# Patient Record
Sex: Female | Born: 1975 | Race: Black or African American | Hispanic: No | Smoking: Current every day smoker
Health system: Southern US, Community
[De-identification: ages and names within clinical notes are randomized; demographics above are authoritative.]

---

## 2005-07-23 ENCOUNTER — Inpatient Hospital Stay (HOSPITAL_COMMUNITY): Admission: AD | Admit: 2005-07-23 | Discharge: 2005-07-23 | Payer: Self-pay | Admitting: Gynecology

## 2007-06-21 ENCOUNTER — Emergency Department: Payer: Self-pay | Admitting: Emergency Medicine

## 2007-08-02 ENCOUNTER — Inpatient Hospital Stay: Payer: Self-pay | Admitting: Unknown Physician Specialty

## 2007-08-02 ENCOUNTER — Other Ambulatory Visit: Payer: Self-pay

## 2008-10-22 ENCOUNTER — Emergency Department: Payer: Self-pay | Admitting: Internal Medicine

## 2008-10-22 ENCOUNTER — Emergency Department: Payer: Self-pay | Admitting: Emergency Medicine

## 2008-11-01 ENCOUNTER — Emergency Department: Payer: Self-pay | Admitting: Emergency Medicine

## 2009-01-17 ENCOUNTER — Emergency Department: Payer: Self-pay | Admitting: Emergency Medicine

## 2009-12-01 ENCOUNTER — Emergency Department: Payer: Self-pay | Admitting: Emergency Medicine

## 2010-01-10 ENCOUNTER — Ambulatory Visit: Payer: Self-pay

## 2012-07-18 IMAGING — CR DG TIBIA/FIBULA 2V*L*
1 series · 3 of 3 positions shown · non-contrast
Comparison: none

REASON FOR EXAM: left leg gross deformity
COMMENTS:

PROCEDURE:     DXR - DXR TIBIA AND FIBULA LT (LOWER L  - December 02, 2009 [DATE]
RESULT:     No fracture or dislocation of the tibia or fibula is seen.
Reportedly the patient had a dislocation of the patella that was reduced
during the course of the exam.

[Series 1: view not recorded · 0.17mm/px · 3 of 3 slices shown]
[im 1/3]
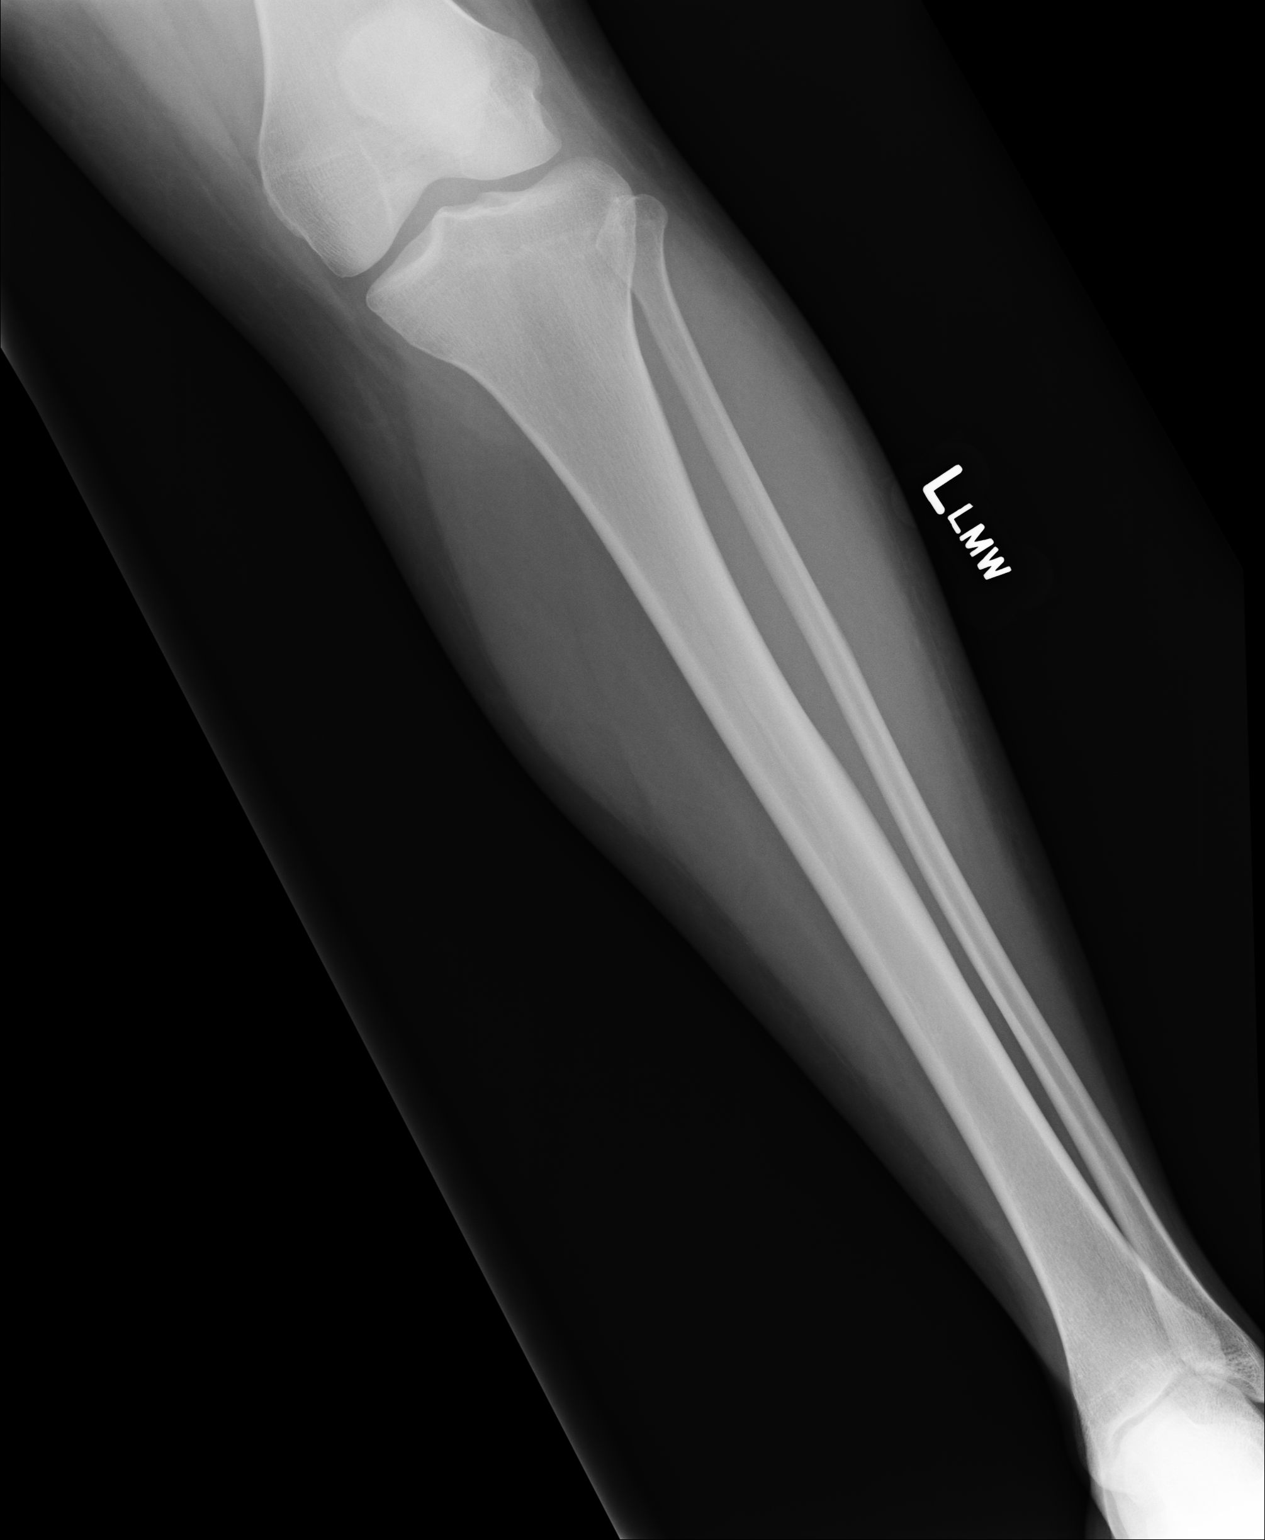
[im 2/3]
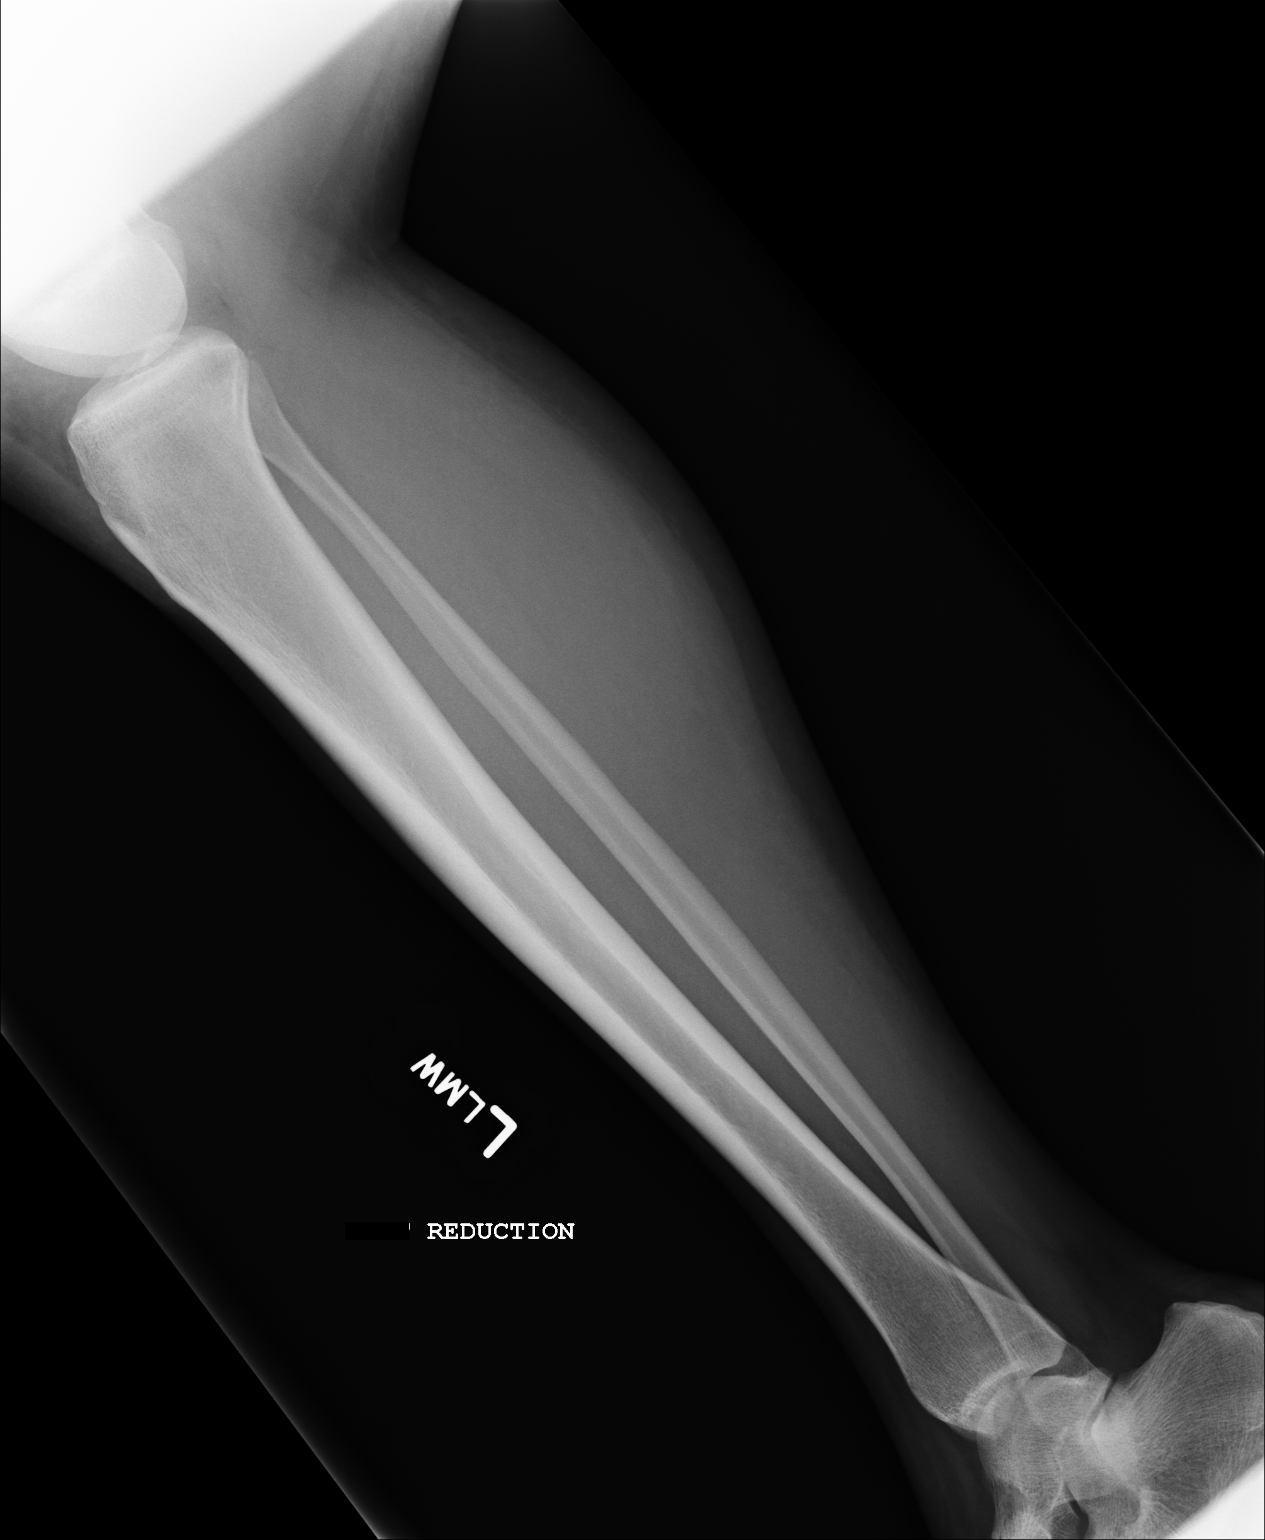
[im 3/3]
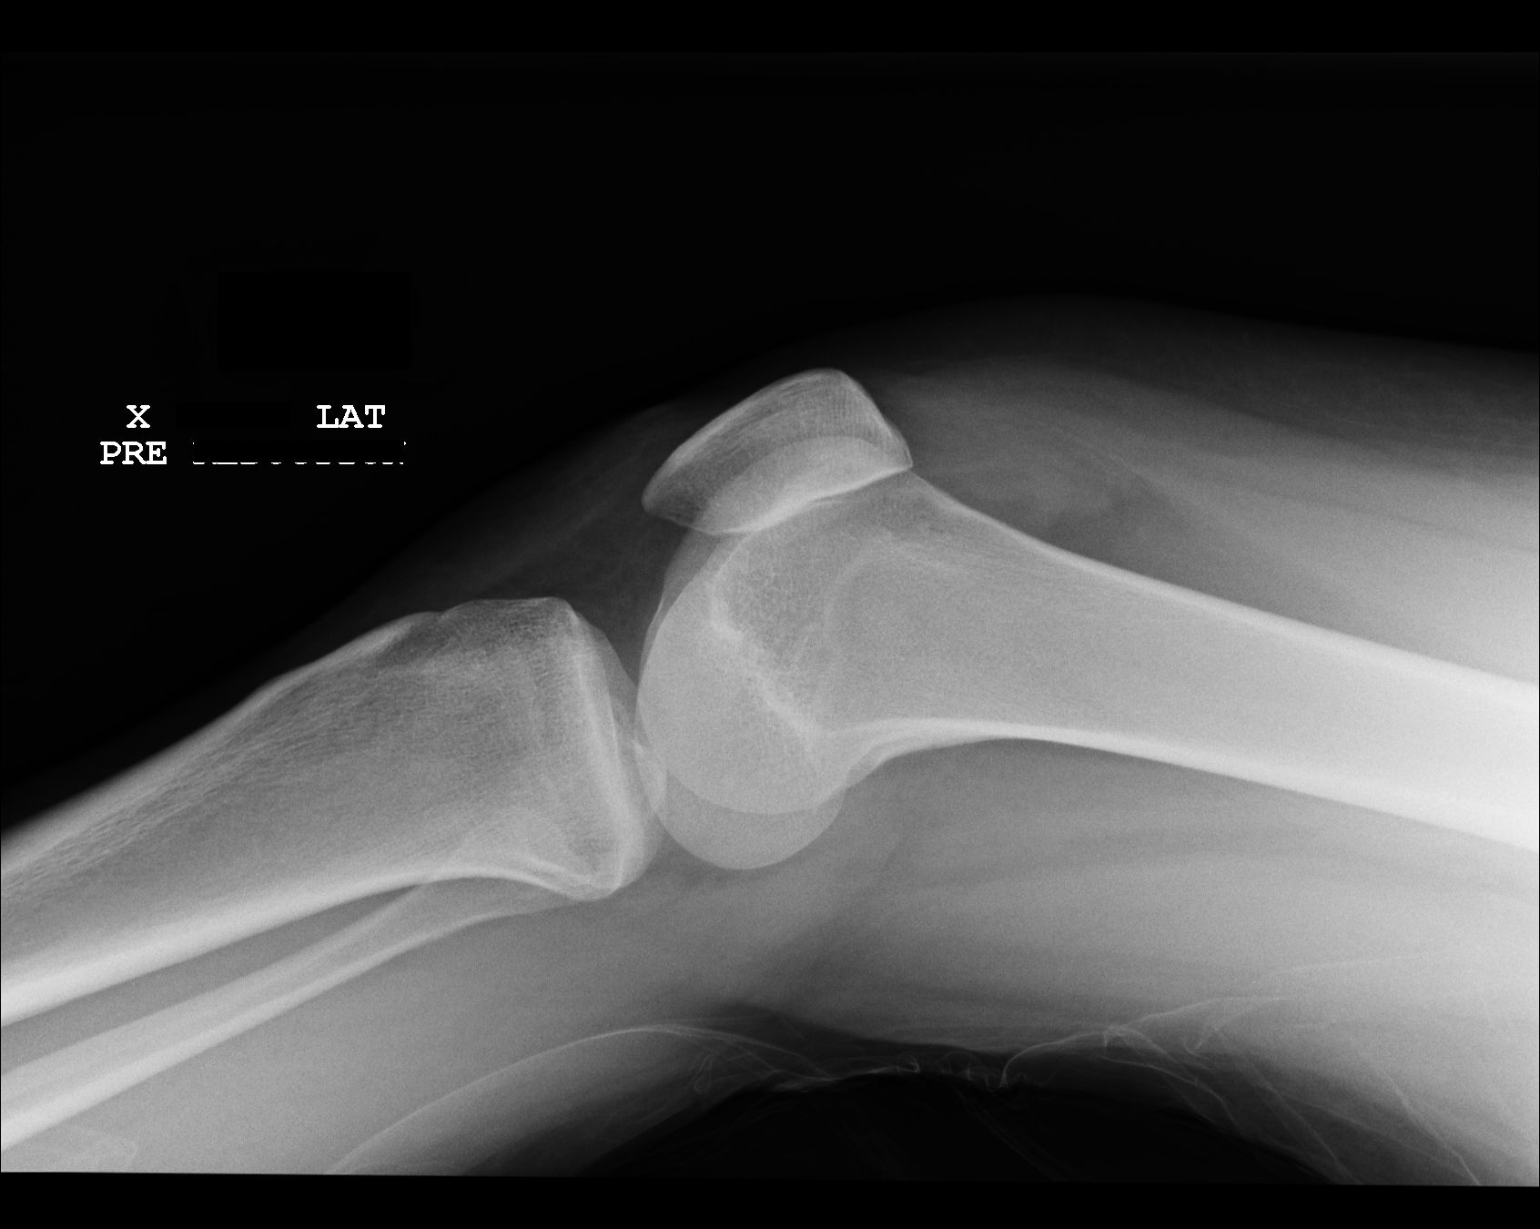

[3 of 3 positions shown; findings below may reference images not displayed]

IMPRESSION: 1.  No acute bony abnormalities of the left lower leg are identified.
2.  The patient reportedly had a dislocation of the patella that was reduced
during the course of this exam.

## 2018-03-22 ENCOUNTER — Other Ambulatory Visit: Payer: Self-pay

## 2018-03-22 ENCOUNTER — Emergency Department
Admission: EM | Admit: 2018-03-22 | Discharge: 2018-03-22 | Disposition: A | Discharge status: Home / Self Care | Payer: Self-pay | Attending: Emergency Medicine | Admitting: Emergency Medicine

## 2018-03-22 ENCOUNTER — Encounter: Payer: Self-pay | Admitting: Emergency Medicine

## 2018-03-22 ENCOUNTER — Emergency Department: Payer: Self-pay

## 2018-03-22 DIAGNOSIS — F17210 Nicotine dependence, cigarettes, uncomplicated: Secondary | ICD-10-CM | POA: Insufficient documentation

## 2018-03-22 DIAGNOSIS — N939 Abnormal uterine and vaginal bleeding, unspecified: Secondary | ICD-10-CM | POA: Insufficient documentation

## 2018-03-22 LAB — POCT PREGNANCY, URINE: Preg Test, Ur: NEGATIVE

## 2018-03-22 NOTE — ED Triage Notes (Signed)
Pt to ED via POV c/o vaginal bleeding and cramping. Pt states that she thought she may be having a miscarriage. Pt denies having positive pregnancy test and states that her LMP was around the first week in January. Pt states that the bleeding started last night and the cramping started this morning. Pt is in NAD at this time.

## 2018-03-22 NOTE — ED Notes (Signed)
Pt not in room upon discharge.  

## 2018-03-22 NOTE — ED Provider Notes (Signed)
[facility] Emergency Department Provider Note   ____________________________________________    I have reviewed the triage vital signs and the nursing notes.   HISTORY  Chief Complaint Vaginal Bleeding     HPI Christy Hayes is a 43 y.o. female who presents with complaints of vaginal bleeding.  Patient reports last night she developed mild pelvic cramping and vaginal bleeding she reports her last menstrual cycle was 3 weeks ago, typically has regular cycles.  She does not believe that she is pregnant.  Denies nausea or vomiting.  No dizziness.  Bleeding and cramping has essentially resolved upon arrival to the emergency department.  History reviewed. No pertinent past medical history.  There are no active problems to display for this patient.   History reviewed. No pertinent surgical history.  Prior to Admission medications   Not on File     Allergies Patient has no known allergies.  No family history on file.  Social History Social History   Tobacco Use  . Smoking status: Current Every Day Smoker  . Smokeless tobacco: Never Used  Substance Use Topics  . Alcohol use: Yes  . Drug use: Yes    Types: Marijuana    Review of Systems  Constitutional: No fever/chills  Cardiovascular: Denies chest pain. Respiratory: Denies shortness of breath. Gastrointestinal: No abdominal pain.   Genitourinary: As above Musculoskeletal: Negative for back pain.  Neurological: Negative for headaches   ____________________________________________   PHYSICAL EXAM:  VITAL SIGNS: ED Triage Vitals [03/22/18 0719]  Enc Vitals Group     BP (!) 126/91     Pulse Rate 89     Resp 16     Temp 98.1 F (36.7 C)     Temp Source Oral     SpO2 99 %     Weight 84.8 kg (187 lb)     Height 1.626 m (5\' 4" )     Head Circumference      Peak Flow      Pain Score 9     Pain Loc      Pain Edu?      Excl. in GC?     Constitutional: Alert and  oriented. No acute distress.  Eyes: Conjunctivae are normal.  Pallor   Cardiovascular: Normal rate, regular rhythm. Grossly normal heart sounds.  Good peripheral circulation. Respiratory: Normal respiratory effort.  No retractions. Lungs CTAB. Gastrointestinal: Soft and nontender. No distention.  No CVA tenderness. Genitourinary: deferred per patient request Musculoskeletal:   Warm and well perfused Neurologic:  Normal speech and language. No gross focal neurologic deficits are appreciated.  Skin:  Skin is warm, dry and intact. No rash noted. Psychiatric: Mood and affect are normal. Speech and behavior are normal.  ____________________________________________   LABS (all labs ordered are listed, but only abnormal results are displayed)  Labs Reviewed  POC URINE PREG, ED  POCT PREGNANCY, URINE   ____________________________________________  EKG  None ____________________________________________  RADIOLOGY  Ultrasound overall reassuring ____________________________________________   PROCEDURES  Procedure(s) performed: No  Procedures   Critical Care performed: No ____________________________________________   INITIAL IMPRESSION / ASSESSMENT AND PLAN / ED COURSE  Pertinent labs & imaging results that were available during my care of the patient were reviewed by me and considered in my medical decision making (see chart for details).  Patient well-appearing in no acute distress.  Presents today with reports of pelvic cramping primarily yesterday with vaginal bleeding now mostly resolved.  Will obtain ultrasound to evaluate for possible  fibroids however overall she is well-appearing with reassuring exam, anticipate GYN follow-up  Ultrasound results are reassuring, I went to deliver results the patient but she has apparently eloped,     ____________________________________________   FINAL CLINICAL IMPRESSION(S) / ED DIAGNOSES  Final diagnoses:  Vaginal bleeding          Note:  This document was prepared using Dragon voice recognition software and may include unintentional dictation errors.   Jene Every, MD 03/22/18 1053

## 2018-03-22 NOTE — ED Notes (Signed)
Pt home phone called at this time with no answer.

## 2018-03-22 NOTE — ED Notes (Signed)
Patient transported to Ultrasound 

## 2020-10-04 ENCOUNTER — Emergency Department
Admission: EM | Admit: 2020-10-04 | Discharge: 2020-10-05 | Disposition: A | Discharge status: Home / Self Care | Payer: Self-pay | Attending: Emergency Medicine | Admitting: Emergency Medicine

## 2020-10-04 ENCOUNTER — Emergency Department: Payer: Self-pay

## 2020-10-04 DIAGNOSIS — Y900 Blood alcohol level of less than 20 mg/100 ml: Secondary | ICD-10-CM | POA: Insufficient documentation

## 2020-10-04 DIAGNOSIS — F1290 Cannabis use, unspecified, uncomplicated: Secondary | ICD-10-CM | POA: Insufficient documentation

## 2020-10-04 DIAGNOSIS — R45851 Suicidal ideations: Secondary | ICD-10-CM | POA: Insufficient documentation

## 2020-10-04 DIAGNOSIS — F1410 Cocaine abuse, uncomplicated: Secondary | ICD-10-CM

## 2020-10-04 DIAGNOSIS — R55 Syncope and collapse: Secondary | ICD-10-CM | POA: Insufficient documentation

## 2020-10-04 DIAGNOSIS — F332 Major depressive disorder, recurrent severe without psychotic features: Secondary | ICD-10-CM | POA: Diagnosis present

## 2020-10-04 DIAGNOSIS — S40811A Abrasion of right upper arm, initial encounter: Secondary | ICD-10-CM | POA: Insufficient documentation

## 2020-10-04 DIAGNOSIS — F1490 Cocaine use, unspecified, uncomplicated: Secondary | ICD-10-CM | POA: Insufficient documentation

## 2020-10-04 DIAGNOSIS — Y92008 Other place in unspecified non-institutional (private) residence as the place of occurrence of the external cause: Secondary | ICD-10-CM | POA: Insufficient documentation

## 2020-10-04 DIAGNOSIS — M25532 Pain in left wrist: Secondary | ICD-10-CM | POA: Insufficient documentation

## 2020-10-04 DIAGNOSIS — F17200 Nicotine dependence, unspecified, uncomplicated: Secondary | ICD-10-CM | POA: Insufficient documentation

## 2020-10-04 DIAGNOSIS — M25562 Pain in left knee: Secondary | ICD-10-CM

## 2020-10-04 DIAGNOSIS — F32A Depression, unspecified: Secondary | ICD-10-CM

## 2020-10-04 DIAGNOSIS — S82092A Other fracture of left patella, initial encounter for closed fracture: Secondary | ICD-10-CM | POA: Insufficient documentation

## 2020-10-04 DIAGNOSIS — R4584 Anhedonia: Secondary | ICD-10-CM | POA: Insufficient documentation

## 2020-10-04 DIAGNOSIS — F4325 Adjustment disorder with mixed disturbance of emotions and conduct: Secondary | ICD-10-CM

## 2020-10-04 LAB — COMPREHENSIVE METABOLIC PANEL
ALT: 16 U/L (ref 0–44)
AST: 39 U/L (ref 15–41)
Albumin: 3.9 g/dL (ref 3.5–5.0)
Alkaline Phosphatase: 48 U/L (ref 38–126)
Anion gap: 8 (ref 5–15)
BUN: 12 mg/dL (ref 6–20)
CO2: 28 mmol/L (ref 22–32)
Calcium: 9 mg/dL (ref 8.9–10.3)
Chloride: 103 mmol/L (ref 98–111)
Creatinine, Ser: 0.94 mg/dL (ref 0.44–1.00)
GFR, Estimated: >60 mL/min (ref >60)
Glucose, Bld: 83 mg/dL (ref 70–99)
Potassium: 3.8 mmol/L (ref 3.5–5.1)
Sodium: 139 mmol/L (ref 135–145)
Total Bilirubin: 0.6 mg/dL (ref 0.3–1.2)
Total Protein: 7.5 g/dL (ref 6.5–8.1)

## 2020-10-04 LAB — CBC
HCT: 39.2 % (ref 36.0–46.0)
Hemoglobin: 12.6 g/dL (ref 12.0–15.0)
MCH: 30.4 pg (ref 26.0–34.0)
MCHC: 32.1 g/dL (ref 30.0–36.0)
MCV: 94.5 fL (ref 80.0–100.0)
Platelets: 407 10*3/uL — ABNORMAL HIGH (ref 150–400)
RBC: 4.15 MIL/uL (ref 3.87–5.11)
RDW: 13.7 % (ref 11.5–15.5)
WBC: 8.8 10*3/uL (ref 4.0–10.5)
nRBC: 0 % (ref 0.0–0.2)

## 2020-10-04 LAB — ACETAMINOPHEN LEVEL: Acetaminophen (Tylenol), Serum: <10 ug/mL — ABNORMAL LOW (ref 10–30)

## 2020-10-04 LAB — SALICYLATE LEVEL: Salicylate Lvl: <7 mg/dL — ABNORMAL LOW (ref 7.0–30.0)

## 2020-10-04 LAB — ETHANOL: Alcohol, Ethyl (B): <10 mg/dL (ref <10)
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
## 2020-10-04 NOTE — ED Notes (Signed)
While trying to preform blood draw, pt jerked back her arm and stated, "I can't do this right now. I will let you draw my blood but not right now. I just need a minute." Pt taken to CT at this time.
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
## 2020-10-04 NOTE — ED Notes (Signed)
X-ray at bedside

## 2020-10-04 NOTE — ED Notes (Signed)
EDP at bedside  

## 2020-10-04 NOTE — ED Notes (Signed)
Pt awakens for psych team to evaluate her with minimal interest in conversation. Attempted to collect assessment after psych team. Pt provides little information. Denies Hi & AVH but gives no other info. Pt states she is homeless
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
## 2020-10-04 NOTE — ED Notes (Signed)
Report received from Ally, RN. Patient currently sleeping, respirations regular and unlabored. Q15 minute rounds and observation by Rover and Officer to continue. Will assess patient once awake.  ?
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
## 2020-10-05 ENCOUNTER — Emergency Department: Payer: Self-pay

## 2020-10-05 DIAGNOSIS — F332 Major depressive disorder, recurrent severe without psychotic features: Secondary | ICD-10-CM

## 2020-10-05 DIAGNOSIS — F1410 Cocaine abuse, uncomplicated: Secondary | ICD-10-CM

## 2020-10-05 DIAGNOSIS — F4325 Adjustment disorder with mixed disturbance of emotions and conduct: Secondary | ICD-10-CM

## 2020-10-05 LAB — URINE DRUG SCREEN, QUALITATIVE (ARMC ONLY)
Amphetamines, Ur Screen: NOT DETECTED
Barbiturates, Ur Screen: NOT DETECTED
Benzodiazepine, Ur Scrn: NOT DETECTED
Cannabinoid 50 Ng, Ur ~~LOC~~: POSITIVE — AB
Cocaine Metabolite,Ur ~~LOC~~: POSITIVE — AB
MDMA (Ecstasy)Ur Screen: NOT DETECTED
Methadone Scn, Ur: NOT DETECTED
Opiate, Ur Screen: NOT DETECTED
Phencyclidine (PCP) Ur S: NOT DETECTED
Tricyclic, Ur Screen: NOT DETECTED

## 2020-10-05 LAB — POC URINE PREG, ED: Preg Test, Ur: NEGATIVE

## 2020-10-05 MED ORDER — ACETAMINOPHEN 325 MG PO TABS
650.0000 mg | ORAL_TABLET | ORAL | Status: DC | PRN
  Administered 2020-10-05 (×2): 650 mg via ORAL
  Filled 2020-10-05 (×2): qty 2

## 2020-10-05 MED ORDER — BACITRACIN-NEOMYCIN-POLYMYXIN OINTMENT TUBE
TOPICAL_OINTMENT | Freq: Every day | CUTANEOUS | Status: DC
  Administered 2020-10-05: 1 via TOPICAL
  Filled 2020-10-05 (×2): qty 14.17

## 2020-10-05 NOTE — ED Notes (Signed)
Patient c/o pain to right index finger. Orders placed per Dr. Sidney Ace.

## 2020-10-05 NOTE — ED Notes (Signed)
Gave patient supplies for a shower. Patient has clean clothes on and is eating breakfast tray in bed at this time.

## 2020-10-05 NOTE — TOC Initial Note (Addendum)
Transition of Care [facility]) - Initial/Assessment Note    Patient Details  Name: Christy DILUZIO MRN: [id number] Date of Birth: [birth year]  [facility] of [facility]) CM/SW Contact:    Clarkston Cellar, RN Phone Number: 10/05/2020, 5:00 PM  Clinical Narrative:                 Consult received for homelessness-patient is not newly homeless and has no physical limitations. Psych cleared. Will provide resource list for homeless shelters and transportation if needed.   Attempted to [employer] [facility] @ [phone] ext [phone]. VM full. Unable to guarantee bed without preregistering. Can provide taxi to shelter.   Outreach to [employer] in attempt to assist patient with placement into homeless shelters. LVMM with intake.         

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
## 2020-10-05 NOTE — ED Notes (Signed)
Patient ambulatory to shower independently, supplies provided per EDT, Emma.

## 2020-10-05 NOTE — Discharge Instructions (Addendum)
Please seek medical attention for any high fevers, chest pain, shortness of breath, change in behavior, persistent vomiting, bloody stool or any other new or concerning symptoms. Please seek medical attention and help for any thoughts about wanting to harm yourself, harm others, any concerning change in behavior, severe depression, inappropriate drug use or any other new or concerning symptoms.

## 2020-10-05 NOTE — ED Notes (Signed)
Pt given bottle soap and washcloth to wash face.

## 2020-10-05 NOTE — ED Notes (Signed)
VOL  

## 2020-10-05 NOTE — ED Notes (Signed)
Pt given dinner tray.

## 2020-10-05 NOTE — ED Notes (Signed)
Pt ambulatory to bathroom independently

## 2020-10-05 NOTE — ED Notes (Signed)
Patient provided dinner tray w/ apple juice per request.

## 2020-10-05 NOTE — ED Notes (Signed)
Pt asleep at this time, unable to collect vitals. Will collect pt vitals once awake. 

## 2020-10-05 NOTE — ED Notes (Signed)
Gave pt graham crackers and peanut butter 

## 2021-09-15 ENCOUNTER — Emergency Department
Admission: EM | Admit: 2021-09-15 | Discharge: 2021-09-16 | Payer: Self-pay | Attending: Emergency Medicine | Admitting: Emergency Medicine

## 2021-09-15 DIAGNOSIS — R55 Syncope and collapse: Secondary | ICD-10-CM | POA: Insufficient documentation

## 2021-09-15 DIAGNOSIS — S29012A Strain of muscle and tendon of back wall of thorax, initial encounter: Secondary | ICD-10-CM | POA: Insufficient documentation

## 2021-09-15 DIAGNOSIS — X58XXXA Exposure to other specified factors, initial encounter: Secondary | ICD-10-CM | POA: Insufficient documentation

## 2021-09-15 NOTE — ED Provider Notes (Addendum)
[facility] Provider Note    Event Date/Time   First MD Initiated Contact with Patient 09/15/21 2338     (approximate)   History   Chief Complaint: Medical Clearance (C/o neck pain on arrival)   HPI  Christy Hayes is a 46 y.o. female brought to the ED by police in custody due to appearing to pass out on the back of the police car on their way to jail.  Patient denies any acute complaints except for some left posterior shoulder pain which she describes as an exacerbation of a chronic pain issue that she has had.  Denies any chest pain or shortness of breath or headache.  Denies any sudden severe pain.     Physical Exam   Triage Vital Signs: ED Triage Vitals [09/15/21 2329]  Enc Vitals Group     BP (!) 155/140     Pulse Rate 70     Resp 17     Temp      Temp src      SpO2 99 %     Weight      Height      Head Circumference      Peak Flow      Pain Score      Pain Loc      Pain Edu?      Excl. in GC?     Most recent vital signs: Vitals:   09/15/21 2329  BP: (!) 155/140  Pulse: 70  Resp: 17  SpO2: 99%    General: Awake, no distress.  CV:  Good peripheral perfusion.  Regular rate and rhythm Resp:  Normal effort.  Clear to auscultation bilaterally.  Symmetric breath sound Abd:  No distention.  Soft and nontender Other:  No bony tenderness or deformities.  No wounds.  No bruising or signs of trauma.  No midline spinal tenderness.  There is some tenderness over the right rhomboids which reproduces her pain.  She has full range of motion of the cervical spine.  Breath smells of alcohol and emotions are somewhat labile, though speech is not slurred.  There are innumerable chronic appearing skin lesions on upper and lower extremities.   ED Results / Procedures / Treatments   Labs (all labs ordered are listed, but only abnormal results are displayed) Labs Reviewed - No data to display   EKG Interpreted by me Sinus rhythm rate of 74.   Normal axis and intervals.  Normal QRS ST segments and T waves.  No evidence of underlying dysrhythmia or ischemia   RADIOLOGY    PROCEDURES:  Procedures   MEDICATIONS ORDERED IN ED: Medications - No data to display   IMPRESSION / MDM / ASSESSMENT AND PLAN / ED COURSE  I reviewed the triage vital signs and the nursing notes.                              Differential diagnosis includes, but is not limited to, syncope, somnolence, intoxication, rhomboid muscle strain, malingering  Patient's presentation is most consistent with acute complicated illness / injury requiring diagnostic workup.  Patient presents with a suspected syncope while riding in the back of a police car and custody.  This may be related to alcohol intoxication. She does report to me that she "dozed off."  In the ED she has essentially normal mental status, unremarkable exam, no acute worrisome symptoms.  Only symptoms are related to a  clinically apparent musculoskeletal pain.  EKG is unremarkable.  She is medically stable for discharge with law enforcement.  I reviewed outside records of primary care that she saw in June 2023.  She had an unremarkable routine visit.  She had screening labs for hepatitis, HIV, STI, CBC, metabolic panel which were all normal.      FINAL CLINICAL IMPRESSION(S) / ED DIAGNOSES   Final diagnoses:  Syncope, unspecified syncope type  Strain of left rhomboid muscle     Rx / DC Orders   ED Discharge Orders     None        Note:  This document was prepared using Dragon voice recognition software and may include unintentional dictation errors.   Sharman Cheek, MD 09/16/21 Aretha Parrot    Sharman Cheek, MD 09/16/21 Ventura Bruns    Sharman Cheek, MD 09/26/21 [phone]

## 2024-03-12 ENCOUNTER — Ambulatory Visit: Payer: Self-pay

## 2024-03-13 ENCOUNTER — Ambulatory Visit: Payer: Self-pay
